# Patient Record
Sex: Female | Born: 1990 | Race: Black or African American | Hispanic: No | Marital: Single | Smoking: Never smoker
Health system: Southern US, Community
[De-identification: ages and names within clinical notes are randomized; demographics above are authoritative.]

## PROBLEM LIST (undated history)

## (undated) DIAGNOSIS — I10 Essential (primary) hypertension: Secondary | ICD-10-CM

## (undated) HISTORY — PX: TONSILLECTOMY: SUR1361

---
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
## 2010-12-05 ENCOUNTER — Emergency Department (HOSPITAL_BASED_OUTPATIENT_CLINIC_OR_DEPARTMENT_OTHER)
Admission: EM | Admit: 2010-12-05 | Discharge: 2010-12-06 | Disposition: A | Discharge status: Home / Self Care | Payer: Medicaid Other | Attending: Emergency Medicine | Admitting: Emergency Medicine

## 2010-12-05 DIAGNOSIS — B9689 Other specified bacterial agents as the cause of diseases classified elsewhere: Secondary | ICD-10-CM | POA: Insufficient documentation

## 2010-12-05 DIAGNOSIS — N9489 Other specified conditions associated with female genital organs and menstrual cycle: Secondary | ICD-10-CM | POA: Insufficient documentation

## 2010-12-05 DIAGNOSIS — A499 Bacterial infection, unspecified: Secondary | ICD-10-CM | POA: Insufficient documentation

## 2010-12-05 DIAGNOSIS — N760 Acute vaginitis: Secondary | ICD-10-CM | POA: Insufficient documentation

## 2010-12-05 DIAGNOSIS — J45909 Unspecified asthma, uncomplicated: Secondary | ICD-10-CM | POA: Insufficient documentation

## 2010-12-05 LAB — URINALYSIS, ROUTINE W REFLEX MICROSCOPIC
Bilirubin Urine: NEGATIVE
Glucose, UA: NEGATIVE mg/dL
Ketones, ur: NEGATIVE mg/dL
Nitrite: NEGATIVE
Protein, ur: NEGATIVE mg/dL
Specific Gravity, Urine: 1.012 (ref 1.005–1.030)
Urobilinogen, UA: 0.2 mg/dL (ref 0.0–1.0)
pH: 7 (ref 5.0–8.0)

## 2010-12-05 LAB — PREGNANCY, URINE: Preg Test, Ur: NEGATIVE

## 2010-12-05 LAB — URINE MICROSCOPIC-ADD ON

## 2010-12-05 LAB — WET PREP, GENITAL
Trich, Wet Prep: NONE SEEN
Yeast Wet Prep HPF POC: NONE SEEN

## 2010-12-08 LAB — GC/CHLAMYDIA PROBE AMP, GENITAL
Chlamydia, DNA Probe: POSITIVE — AB
GC Probe Amp, Genital: NEGATIVE

## 2010-12-28 ENCOUNTER — Emergency Department (HOSPITAL_BASED_OUTPATIENT_CLINIC_OR_DEPARTMENT_OTHER)
Admission: EM | Admit: 2010-12-28 | Discharge: 2010-12-28 | Disposition: A | Discharge status: Home / Self Care | Payer: Self-pay | Attending: Emergency Medicine | Admitting: Emergency Medicine

## 2010-12-28 DIAGNOSIS — B9689 Other specified bacterial agents as the cause of diseases classified elsewhere: Secondary | ICD-10-CM | POA: Insufficient documentation

## 2010-12-28 DIAGNOSIS — R109 Unspecified abdominal pain: Secondary | ICD-10-CM | POA: Insufficient documentation

## 2010-12-28 DIAGNOSIS — N760 Acute vaginitis: Secondary | ICD-10-CM | POA: Insufficient documentation

## 2010-12-28 DIAGNOSIS — A499 Bacterial infection, unspecified: Secondary | ICD-10-CM | POA: Insufficient documentation

## 2010-12-28 DIAGNOSIS — J45909 Unspecified asthma, uncomplicated: Secondary | ICD-10-CM | POA: Insufficient documentation

## 2010-12-28 DIAGNOSIS — Z202 Contact with and (suspected) exposure to infections with a predominantly sexual mode of transmission: Secondary | ICD-10-CM | POA: Insufficient documentation

## 2010-12-28 LAB — WET PREP, GENITAL: Yeast Wet Prep HPF POC: NONE SEEN

## 2010-12-28 LAB — URINALYSIS, ROUTINE W REFLEX MICROSCOPIC
Bilirubin Urine: NEGATIVE
Ketones, ur: NEGATIVE mg/dL
Nitrite: NEGATIVE
Specific Gravity, Urine: 1.02 (ref 1.005–1.030)
Urobilinogen, UA: 1 mg/dL (ref 0.0–1.0)

## 2010-12-28 LAB — PREGNANCY, URINE: Preg Test, Ur: NEGATIVE

## 2011-03-13 ENCOUNTER — Emergency Department (HOSPITAL_BASED_OUTPATIENT_CLINIC_OR_DEPARTMENT_OTHER)
Admission: EM | Admit: 2011-03-13 | Discharge: 2011-03-13 | Disposition: A | Discharge status: Home / Self Care | Payer: Medicaid Other | Attending: Emergency Medicine | Admitting: Emergency Medicine

## 2011-03-13 DIAGNOSIS — J45909 Unspecified asthma, uncomplicated: Secondary | ICD-10-CM | POA: Insufficient documentation

## 2011-03-13 DIAGNOSIS — S61209A Unspecified open wound of unspecified finger without damage to nail, initial encounter: Secondary | ICD-10-CM | POA: Insufficient documentation

## 2011-03-13 DIAGNOSIS — X58XXXA Exposure to other specified factors, initial encounter: Secondary | ICD-10-CM | POA: Insufficient documentation

## 2013-04-03 ENCOUNTER — Emergency Department (HOSPITAL_BASED_OUTPATIENT_CLINIC_OR_DEPARTMENT_OTHER)
Admission: EM | Admit: 2013-04-03 | Discharge: 2013-04-03 | Disposition: A | Discharge status: Home / Self Care | Payer: Self-pay | Attending: Emergency Medicine | Admitting: Emergency Medicine

## 2013-04-03 ENCOUNTER — Encounter (HOSPITAL_BASED_OUTPATIENT_CLINIC_OR_DEPARTMENT_OTHER): Payer: Self-pay | Admitting: *Deleted

## 2013-04-03 DIAGNOSIS — R52 Pain, unspecified: Secondary | ICD-10-CM | POA: Insufficient documentation

## 2013-04-03 DIAGNOSIS — Z3202 Encounter for pregnancy test, result negative: Secondary | ICD-10-CM | POA: Insufficient documentation

## 2013-04-03 DIAGNOSIS — R109 Unspecified abdominal pain: Secondary | ICD-10-CM | POA: Insufficient documentation

## 2013-04-03 DIAGNOSIS — A499 Bacterial infection, unspecified: Secondary | ICD-10-CM | POA: Insufficient documentation

## 2013-04-03 DIAGNOSIS — Z8619 Personal history of other infectious and parasitic diseases: Secondary | ICD-10-CM | POA: Insufficient documentation

## 2013-04-03 DIAGNOSIS — Z711 Person with feared health complaint in whom no diagnosis is made: Secondary | ICD-10-CM

## 2013-04-03 DIAGNOSIS — N760 Acute vaginitis: Secondary | ICD-10-CM | POA: Insufficient documentation

## 2013-04-03 DIAGNOSIS — B9689 Other specified bacterial agents as the cause of diseases classified elsewhere: Secondary | ICD-10-CM

## 2013-04-03 DIAGNOSIS — K219 Gastro-esophageal reflux disease without esophagitis: Secondary | ICD-10-CM | POA: Insufficient documentation

## 2013-04-03 LAB — URINALYSIS, ROUTINE W REFLEX MICROSCOPIC
Bilirubin Urine: NEGATIVE
Glucose, UA: NEGATIVE mg/dL
Hgb urine dipstick: NEGATIVE
Ketones, ur: NEGATIVE mg/dL
Leukocytes, UA: NEGATIVE
Nitrite: NEGATIVE
Protein, ur: NEGATIVE mg/dL
Specific Gravity, Urine: 1.025 (ref 1.005–1.030)
Urobilinogen, UA: 1 mg/dL (ref 0.0–1.0)
pH: 7.5 (ref 5.0–8.0)

## 2013-04-03 LAB — WET PREP, GENITAL
Trich, Wet Prep: NONE SEEN
Yeast Wet Prep HPF POC: NONE SEEN

## 2013-04-03 MED ORDER — AZITHROMYCIN 250 MG PO TABS
1000.0000 mg | ORAL_TABLET | Freq: Once | ORAL | Status: AC
  Administered 2013-04-03: 1000 mg via ORAL
  Filled 2013-04-03: qty 4

## 2013-04-03 MED ORDER — CEFTRIAXONE SODIUM 250 MG IJ SOLR
250.0000 mg | Freq: Once | INTRAMUSCULAR | Status: AC
  Administered 2013-04-03: 250 mg via INTRAMUSCULAR
  Filled 2013-04-03: qty 250

## 2013-04-03 MED ORDER — OMEPRAZOLE 20 MG PO CPDR: 20.0000 mg | DELAYED_RELEASE_CAPSULE | Freq: Every day | ORAL | Status: DC

## 2013-04-03 MED ORDER — METRONIDAZOLE 500 MG PO TABS: 500.0000 mg | ORAL_TABLET | Freq: Two times a day (BID) | ORAL | Status: DC

## 2013-04-03 MED ORDER — LIDOCAINE HCL (PF) 1 % IJ SOLN
INTRAMUSCULAR | Status: AC
  Administered 2013-04-03: 5 mL
  Filled 2013-04-03: qty 5

## 2013-04-03 NOTE — ED Notes (Signed)
Pelvic Cart at bedside 

## 2013-04-03 NOTE — ED Provider Notes (Signed)
History    CSN: [id number] Arrival date & time 04/03/13  1544  First MD Initiated Contact with Patient 04/03/13 1609     Chief Complaint  Patient presents with  . Vaginal Discharge   (Consider location/radiation/quality/duration/timing/severity/associated sxs/prior Treatment) Patient is a 22 y.o. female presenting with vaginal discharge. The history is provided by the patient and medical records. No language interpreter was used.  Vaginal Discharge Severity:  Moderate Associated symptoms: abdominal pain (mild, generalized, vague)   Associated symptoms: no dysuria, no fever, no nausea and no vomiting     Mackenzie Noble is a 22 y.o. female  with a Hx of GERD presents to the Emergency Department complaining of gradual, persistent, progressively worsening vaginal discharge worsening 3 weeks ago.  Associated symptoms include malodorous discharge, mild generalized aching throughout the abd. Pt states she has normal vaginal discharge every day for which she wears a panty liner but this is different.  Pt has 1 sexual partner but uses condoms inconsistently   Ibuprofen makes the abd pain better and nothing makes it worse.  Pt denies douching or scented body wash.  Pt with Hx of STD in 2012 which was chlamydia.  Pt was treated at the time, but states she thinks that is what she has again.  Pt denies fever, chills, headache, neck pain, SOB, cough, congestion, N/V/D, weakness, dizziness, syncope, dysuria, hematuria.  LMP 2 weeks ago.   History reviewed. No pertinent past medical history. History reviewed. No pertinent past surgical history. History reviewed. No pertinent family history. History  Substance Use Topics  . Smoking status: Not on file  . Smokeless tobacco: Not on file  . Alcohol Use: Not on file   OB History   Grav Para Term Preterm Abortions TAB SAB Ect Mult Living                 Review of Systems  Constitutional: Negative for fever, diaphoresis, appetite change, fatigue and  unexpected weight change.  HENT: Negative for neck pain and neck stiffness.   Respiratory: Negative for chest tightness and shortness of breath.   Cardiovascular: Negative for chest pain.  Gastrointestinal: Positive for abdominal pain (mild, generalized, vague). Negative for nausea, vomiting, diarrhea, constipation, blood in stool, abdominal distention and rectal pain.  Genitourinary: Positive for vaginal discharge. Negative for dysuria, urgency, frequency, hematuria, flank pain, vaginal bleeding, difficulty urinating, vaginal pain, menstrual problem and pelvic pain.  Musculoskeletal: Negative for back pain.  Skin: Negative for rash.  Neurological: Negative for dizziness, weakness and light-headedness.  All other systems reviewed and are negative.    Allergies  Sulfa antibiotics  Home Medications   Current Outpatient Rx  Name  Route  Sig  Dispense  Refill  . metroNIDAZOLE (FLAGYL) 500 MG tablet   Oral   Take 1 tablet (500 mg total) by mouth 2 (two) times daily. One po bid x 7 days   14 tablet   0   . omeprazole (PRILOSEC) 20 MG capsule   Oral   Take 1 capsule (20 mg total) by mouth daily.   30 capsule   0    BP 127/76  Pulse 79  Temp(Src) 98.3 F (36.8 C) (Oral)  Resp 18  SpO2 99%  LMP 03/20/2013 Physical Exam  Nursing note and vitals reviewed. Constitutional: She is oriented to person, place, and time. She appears well-developed and well-nourished. No distress.  HENT:  Head: Normocephalic and atraumatic.  Eyes: Conjunctivae are normal. No scleral icterus.  Neck: Normal range of motion.  Neck supple.  Cardiovascular: Normal rate, regular rhythm, normal heart sounds and intact distal pulses.   No murmur heard. Pulmonary/Chest: Effort normal and breath sounds normal. No respiratory distress. She has no wheezes.  Abdominal: Soft. Normal appearance and bowel sounds are normal. She exhibits no distension and no mass. There is no hepatosplenomegaly. There is generalized  tenderness (very mild, generalized). There is no rebound, no guarding and no CVA tenderness.  Genitourinary: Uterus normal. Pelvic exam was performed with patient supine. There is no rash, tenderness, lesion or injury on the right labia. There is no rash, tenderness, lesion or injury on the left labia. Uterus is not deviated, not enlarged, not fixed and not tender. Cervix exhibits no motion tenderness, no discharge and no friability. Right adnexum displays no mass, no tenderness and no fullness. Left adnexum displays no mass, no tenderness and no fullness. No erythema, tenderness or bleeding around the vagina. No foreign body around the vagina. No signs of injury around the vagina. Vaginal discharge (moderate, thin, white) found.  Musculoskeletal: Normal range of motion. She exhibits no edema.  Lymphadenopathy:    She has no cervical adenopathy.  Neurological: She is alert and oriented to person, place, and time. She exhibits normal muscle tone. Coordination normal.  Speech is clear and goal oriented Moves extremities without ataxia  Skin: Skin is warm and dry. No rash noted. She is not diaphoretic.  Psychiatric: She has a normal mood and affect.    ED Course  Procedures (including critical care time) Labs Reviewed  WET PREP, GENITAL - Abnormal; Notable for the following:    Clue Cells Wet Prep HPF POC MODERATE (*)    WBC, Wet Prep HPF POC TOO NUMEROUS TO COUNT (*)    All other components within normal limits  GC/CHLAMYDIA PROBE AMP  URINALYSIS, ROUTINE W REFLEX MICROSCOPIC  PREGNANCY, URINE  RPR  HIV ANTIBODY (ROUTINE TESTING)   No results found. 1. BV (bacterial vaginosis)   2. Concern about STD in female without diagnosis     MDM  Mackenzie Noble presents with vaginal discharge and concern for STD.  UA without evidence of urinary tract infection. Pt understands that they have GC/Chlamydia, syphilis and HIV cultures pending and that they will need to inform all sexual partners if  results return positive. Pt has been treated prophylacticly with azithromycin and rocephin due to pts history, pelvic exam, and wet prep with increased WBCs. Pt not concerning for PID because hemodynamically stable and no cervical motion tenderness on pelvic exam. Pt has also been treated with flagyl for Bacterial Vaginosis. Pt has been advised to not drink alcohol while on this medication. Patient to be discharged with instructions to follow up with OBGYN. Discussed importance of using protection when sexually active.  I have also discussed reasons to return immediately to the ER.  Patient expresses understanding and agrees with plan.    Mackenzie Client Jaimey Franchini, PA-C 04/03/13 1750

## 2013-04-03 NOTE — ED Provider Notes (Signed)
Medical screening examination/treatment/procedure(s) were performed by non-physician practitioner and as supervising physician I was immediately available for consultation/collaboration.  Ethelda Chick, MD 04/03/13 [phone]

## 2013-04-03 NOTE — ED Notes (Signed)
Pt amb to triage with quick steady gait smiling in nad. Pt reports vag discharge with "funny smell". Also cramping at times, none present at this time.

## 2013-04-04 LAB — RPR: RPR Ser Ql: NONREACTIVE

## 2013-04-04 LAB — GC/CHLAMYDIA PROBE AMP: CT Probe RNA: POSITIVE — AB

## 2013-04-05 NOTE — ED Notes (Signed)
+   Gonorrhea +Chlamydia Treated with Rocephin and Zithromax

## 2013-04-06 ENCOUNTER — Telehealth (HOSPITAL_COMMUNITY): Payer: Self-pay | Admitting: *Deleted

## 2013-04-08 ENCOUNTER — Telehealth (HOSPITAL_COMMUNITY): Payer: Self-pay | Admitting: Emergency Medicine

## 2013-04-11 ENCOUNTER — Telehealth (HOSPITAL_COMMUNITY): Payer: Self-pay | Admitting: Emergency Medicine

## 2013-04-11 NOTE — Telephone Encounter (Signed)
Patient notified of + gonorrhea and chlamydia results after ID verified x three. STD instructions provided. Patient verbalized understanding.

## 2013-06-02 ENCOUNTER — Encounter (HOSPITAL_BASED_OUTPATIENT_CLINIC_OR_DEPARTMENT_OTHER): Payer: Self-pay

## 2013-06-02 ENCOUNTER — Emergency Department (HOSPITAL_BASED_OUTPATIENT_CLINIC_OR_DEPARTMENT_OTHER)
Admission: EM | Admit: 2013-06-02 | Discharge: 2013-06-02 | Disposition: A | Discharge status: Home / Self Care | Payer: Medicaid Other | Attending: Emergency Medicine | Admitting: Emergency Medicine

## 2013-06-02 DIAGNOSIS — N898 Other specified noninflammatory disorders of vagina: Secondary | ICD-10-CM | POA: Insufficient documentation

## 2013-06-02 DIAGNOSIS — Z3202 Encounter for pregnancy test, result negative: Secondary | ICD-10-CM | POA: Insufficient documentation

## 2013-06-02 DIAGNOSIS — R300 Dysuria: Secondary | ICD-10-CM | POA: Insufficient documentation

## 2013-06-02 DIAGNOSIS — Z8619 Personal history of other infectious and parasitic diseases: Secondary | ICD-10-CM | POA: Insufficient documentation

## 2013-06-02 LAB — URINALYSIS, ROUTINE W REFLEX MICROSCOPIC
Glucose, UA: NEGATIVE mg/dL
Hgb urine dipstick: NEGATIVE
Specific Gravity, Urine: 1.042 — ABNORMAL HIGH (ref 1.005–1.030)
Urobilinogen, UA: 0.2 mg/dL (ref 0.0–1.0)
pH: 6 (ref 5.0–8.0)

## 2013-06-02 LAB — PREGNANCY, URINE: Preg Test, Ur: NEGATIVE

## 2013-06-02 LAB — WET PREP, GENITAL: Yeast Wet Prep HPF POC: NONE SEEN

## 2013-06-02 NOTE — ED Provider Notes (Signed)
CSN: [id number]     Arrival date & time 06/02/13  1205 History   First MD Initiated Contact with Patient 06/02/13 1230     Chief Complaint  Patient presents with  . Dysuria   (Consider location/radiation/quality/duration/timing/severity/associated sxs/prior Treatment) Patient is a 22 y.o. female presenting with dysuria.  Dysuria  Pt reports a month of burning with urination. She states she took a Macrobid once weekly with no improvement. Has also taken AZO with some relief. She denies any fever, back pain or vomiting. She was seen about 2 months ago for similar and found to have BV and also treated for GC/C which came back positive. She states single sexual partner who was also treated at that time and she does not have any concerns for recurrent infection. She thinks she may have a yeast infection but denies any discharge or bleeding.   History reviewed. No pertinent past medical history. History reviewed. No pertinent past surgical history. No family history on file. History  Substance Use Topics  . Smoking status: Never Smoker   . Smokeless tobacco: Not on file  . Alcohol Use: Not on file   OB History   Grav Para Term Preterm Abortions TAB SAB Ect Mult Living                 Review of Systems  Genitourinary: Positive for dysuria.   All other systems reviewed and are negative except as noted in HPI.   Allergies  Sulfa antibiotics  Home Medications  No current outpatient prescriptions on file. BP 144/94  Pulse 72  Temp(Src) 98.7 F (37.1 C) (Oral)  Resp 18  SpO2 100%  LMP 05/08/2013 Physical Exam  Nursing note and vitals reviewed. Constitutional: She is oriented to person, place, and time. She appears well-developed and well-nourished.  HENT:  Head: Normocephalic and atraumatic.  Right Ear: Tympanic membrane normal.  Left Ear: Tympanic membrane normal.  Eyes: EOM are normal. Pupils are equal, round, and reactive to light.  Neck: Normal range of motion. Neck supple.   Cardiovascular: Normal rate, normal heart sounds and intact distal pulses.   Pulmonary/Chest: Effort normal and breath sounds normal.  Abdominal: Bowel sounds are normal. She exhibits no distension. There is no tenderness.  Genitourinary:  Thin white vaginal discharge, no external signs of yeast, no CMT or cervical friability, diffuse lower abdominal tenderness, no mass  Musculoskeletal: Normal range of motion. She exhibits no edema and no tenderness.  Neurological: She is alert and oriented to person, place, and time. She has normal strength. No cranial nerve deficit or sensory deficit.  Skin: Skin is warm and dry. No rash noted.  Psychiatric: She has a normal mood and affect.    ED Course  Procedures (including critical care time) Labs Review Labs Reviewed  WET PREP, GENITAL - Abnormal; Notable for the following:    WBC, Wet Prep HPF POC FEW (*)    All other components within normal limits  URINALYSIS, ROUTINE W REFLEX MICROSCOPIC - Abnormal; Notable for the following:    Specific Gravity, Urine 1.042 (*)    All other components within normal limits  GC/CHLAMYDIA PROBE AMP  PREGNANCY, URINE   Imaging Review No results found.  MDM   1. Dysuria   2. Vaginal discharge     UA neg for signs of infection. No BV or yeast on wet prep. Pt adamant that she cannot possibly have STD and wants to wait for GC/C swab results before being treated. She was advised to see  PCP or Urology for persistent dysuria despite negative UA.     Marino Rogerson B. Bernette Mayers, MD 06/02/13 1347
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
## 2015-03-11 ENCOUNTER — Emergency Department (HOSPITAL_BASED_OUTPATIENT_CLINIC_OR_DEPARTMENT_OTHER)
Admission: EM | Admit: 2015-03-11 | Discharge: 2015-03-11 | Disposition: A | Discharge status: Home / Self Care | Payer: Medicaid Other | Attending: Emergency Medicine | Admitting: Emergency Medicine

## 2015-03-11 ENCOUNTER — Encounter (HOSPITAL_BASED_OUTPATIENT_CLINIC_OR_DEPARTMENT_OTHER): Payer: Self-pay | Admitting: *Deleted

## 2015-03-11 DIAGNOSIS — M25571 Pain in right ankle and joints of right foot: Secondary | ICD-10-CM | POA: Insufficient documentation

## 2015-03-11 MED ORDER — ACETAMINOPHEN 500 MG PO TABS
1000.0000 mg | ORAL_TABLET | Freq: Once | ORAL | Status: AC
  Administered 2015-03-11: 1000 mg via ORAL
  Filled 2015-03-11: qty 2

## 2015-03-11 NOTE — ED Notes (Signed)
Rt foot elevated, ice pack applied 

## 2015-03-11 NOTE — Discharge Instructions (Signed)

## 2015-03-11 NOTE — ED Notes (Signed)
Having rt ankle pain,
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
## 2019-02-26 ENCOUNTER — Other Ambulatory Visit: Payer: Self-pay

## 2019-02-26 ENCOUNTER — Encounter (HOSPITAL_BASED_OUTPATIENT_CLINIC_OR_DEPARTMENT_OTHER): Payer: Self-pay | Admitting: *Deleted

## 2019-02-26 ENCOUNTER — Emergency Department (HOSPITAL_BASED_OUTPATIENT_CLINIC_OR_DEPARTMENT_OTHER)
Admission: EM | Admit: 2019-02-26 | Discharge: 2019-02-26 | Disposition: A | Discharge status: Home / Self Care | Payer: Medicaid Other | Attending: Emergency Medicine | Admitting: Emergency Medicine

## 2019-02-26 DIAGNOSIS — R42 Dizziness and giddiness: Secondary | ICD-10-CM

## 2019-02-26 DIAGNOSIS — Z79899 Other long term (current) drug therapy: Secondary | ICD-10-CM | POA: Insufficient documentation

## 2019-02-26 DIAGNOSIS — Z3A27 27 weeks gestation of pregnancy: Secondary | ICD-10-CM | POA: Diagnosis not present

## 2019-02-26 DIAGNOSIS — O99355 Diseases of the nervous system complicating the puerperium: Secondary | ICD-10-CM | POA: Insufficient documentation

## 2019-02-26 LAB — COMPREHENSIVE METABOLIC PANEL
ALT: 10 U/L (ref 0–44)
AST: 13 U/L — ABNORMAL LOW (ref 15–41)
Albumin: 3 g/dL — ABNORMAL LOW (ref 3.5–5.0)
Alkaline Phosphatase: 89 U/L (ref 38–126)
Anion gap: 8 (ref 5–15)
BUN: 8 mg/dL (ref 6–20)
CO2: 23 mmol/L (ref 22–32)
Calcium: 9 mg/dL (ref 8.9–10.3)
Chloride: 104 mmol/L (ref 98–111)
Creatinine, Ser: 0.46 mg/dL (ref 0.44–1.00)
GFR calc Af Amer: >60 mL/min (ref >60)
GFR calc non Af Amer: >60 mL/min (ref >60)
Glucose, Bld: 95 mg/dL (ref 70–99)
Potassium: 3.7 mmol/L (ref 3.5–5.1)
Sodium: 135 mmol/L (ref 135–145)
Total Bilirubin: 0.3 mg/dL (ref 0.3–1.2)
Total Protein: 6.4 g/dL — ABNORMAL LOW (ref 6.5–8.1)

## 2019-02-26 LAB — CBC WITH DIFFERENTIAL/PLATELET
Abs Immature Granulocytes: 0.06 10*3/uL (ref 0.00–0.07)
Basophils Absolute: 0 10*3/uL (ref 0.0–0.1)
Basophils Relative: 0 %
Eosinophils Absolute: 0.1 10*3/uL (ref 0.0–0.5)
Eosinophils Relative: 1 %
HCT: 33.9 % — ABNORMAL LOW (ref 36.0–46.0)
Hemoglobin: 10.7 g/dL — ABNORMAL LOW (ref 12.0–15.0)
Immature Granulocytes: 1 %
Lymphocytes Relative: 18 %
Lymphs Abs: 1.8 10*3/uL (ref 0.7–4.0)
MCH: 24.7 pg — ABNORMAL LOW (ref 26.0–34.0)
MCHC: 31.6 g/dL (ref 30.0–36.0)
MCV: 78.1 fL — ABNORMAL LOW (ref 80.0–100.0)
Monocytes Absolute: 0.8 10*3/uL (ref 0.1–1.0)
Monocytes Relative: 8 %
Neutro Abs: 7.2 10*3/uL (ref 1.7–7.7)
Neutrophils Relative %: 72 %
Platelets: 227 10*3/uL (ref 150–400)
RBC: 4.34 MIL/uL (ref 3.87–5.11)
RDW: 15.4 % (ref 11.5–15.5)
WBC: 10 10*3/uL (ref 4.0–10.5)
nRBC: 0 % (ref 0.0–0.2)

## 2019-02-26 LAB — URINALYSIS, ROUTINE W REFLEX MICROSCOPIC
Bilirubin Urine: NEGATIVE
Glucose, UA: NEGATIVE mg/dL
Hgb urine dipstick: NEGATIVE
Ketones, ur: NEGATIVE mg/dL
Leukocytes,Ua: NEGATIVE
Nitrite: NEGATIVE
Protein, ur: NEGATIVE mg/dL
Specific Gravity, Urine: 1.02 (ref 1.005–1.030)
pH: 7.5 (ref 5.0–8.0)

## 2019-02-26 MED ORDER — MECLIZINE HCL 25 MG PO TABS: 25.0000 mg | ORAL_TABLET | Freq: Three times a day (TID) | ORAL | 0 refills | Status: DC | PRN

## 2019-02-26 MED ORDER — MECLIZINE HCL 25 MG PO TABS
25.0000 mg | ORAL_TABLET | Freq: Once | ORAL | Status: DC
  Filled 2019-02-26: qty 1

## 2019-02-26 NOTE — ED Notes (Signed)
FHR for first 155; 2nd FHR 148

## 2019-02-26 NOTE — ED Provider Notes (Signed)
[facility] EMERGENCY DEPARTMENT Provider Note   CSN: [id number] Arrival date & time: 02/26/19  1047    History   Chief Complaint Chief Complaint  Patient presents with  . Dizziness    HPI Mackenzie Noble is a 28 y.o. female.     HPI Patient is roughly [redacted] weeks pregnant with twin pregnancy.  Was on her way to work this morning when she had acute onset spinning sensation.  Symptoms seem to have improved significantly.  Denies any associated pain.  States she is had symptoms in the past.  Also states she has intermittent ringing in her left ear.  No fever or chills.  Denies chest pain or shortness of breath.  No abdominal pain.  No vaginal bleeding. History reviewed. No pertinent past medical history.  There are no active problems to display for this patient.   Past Surgical History:  Procedure Laterality Date  . TONSILLECTOMY       OB History    Gravida  3   Para  2   Term      Preterm      AB      Living        SAB      TAB      Ectopic      Multiple      Live Births               Home Medications    Prior to Admission medications   Medication Sig Start Date End Date Taking? Authorizing Provider  meclizine (ANTIVERT) 25 MG tablet Take 1 tablet (25 mg total) by mouth 3 (three) times daily as needed for dizziness. 02/26/19   Loren RacerYelverton, Tearra Ouk, MD  Prenatal Vit-Fe Fumarate-FA (PRENATAL VITAMIN) 27-0.8 MG TABS Take 1 tablet by mouth 1 day or 1 dose.    [provider]    Family History History reviewed. No pertinent family history.  Social History Social History   Tobacco Use  . Smoking status: Never Smoker  . Smokeless tobacco: Never Used  Substance Use Topics  . Alcohol use: Not Currently    Comment: pt is currently pregnant  . Drug use: Never     Allergies   Sulfa antibiotics   Review of Systems Review of Systems  Constitutional: Negative for chills and fever.  HENT: Negative for trouble swallowing.   Eyes:  Negative for visual disturbance.  Respiratory: Negative for cough and shortness of breath.   Cardiovascular: Negative for chest pain and palpitations.  Gastrointestinal: Negative for abdominal pain, diarrhea and vomiting.  Genitourinary: Negative for dysuria, flank pain, frequency, hematuria, pelvic pain, vaginal bleeding and vaginal discharge.  Musculoskeletal: Negative for back pain, myalgias and neck pain.  Skin: Negative for rash and wound.  Neurological: Positive for dizziness. Negative for syncope, weakness, light-headedness, numbness and headaches.  All other systems reviewed and are negative.    Physical Exam Updated Vital Signs BP 103/68 (BP Location: Left Arm)   Pulse 88   Temp 98.3 F (36.8 C) (Oral)   Resp 19   Ht 5' (1.524 m)   Wt 122.5 kg   LMP 08/23/2018 (Approximate)   SpO2 100%   BMI 52.73 kg/m   Physical Exam Vitals signs and nursing note reviewed.  Constitutional:      Appearance: Normal appearance. She is well-developed.  HENT:     Head: Normocephalic and atraumatic.     Nose: Nose normal.     Mouth/Throat:     Mouth: Mucous membranes  are moist.  Eyes:     Pupils: Pupils are equal, round, and reactive to light.     Comments: Fatigable rotary nystagmus especially with sitting up.  Neck:     Musculoskeletal: Normal range of motion and neck supple. No neck rigidity or muscular tenderness.  Cardiovascular:     Rate and Rhythm: Normal rate and regular rhythm.     Heart sounds: No murmur. No friction rub. No gallop.   Pulmonary:     Effort: Pulmonary effort is normal. No respiratory distress.     Breath sounds: Normal breath sounds. No stridor. No wheezing, rhonchi or rales.  Chest:     Chest wall: No tenderness.  Abdominal:     General: Bowel sounds are normal.     Palpations: Abdomen is soft.     Tenderness: There is no abdominal tenderness. There is no guarding or rebound.     Comments: Gravid abdomen.  Nontender to palpation.  Musculoskeletal:  Normal range of motion.        General: No swelling, tenderness, deformity or signs of injury.     Right lower leg: No edema.     Left lower leg: No edema.  Lymphadenopathy:     Cervical: No cervical adenopathy.  Skin:    General: Skin is warm and dry.     Findings: No erythema or rash.  Neurological:     General: No focal deficit present.     Mental Status: She is alert and oriented to person, place, and time.  Psychiatric:        Mood and Affect: Mood normal.        Behavior: Behavior normal.      ED Treatments / Results  Labs (all labs ordered are listed, but only abnormal results are displayed) Labs Reviewed  CBC WITH DIFFERENTIAL/PLATELET - Abnormal; Notable for the following components:      Result Value   Hemoglobin 10.7 (*)    HCT 33.9 (*)    MCV 78.1 (*)    MCH 24.7 (*)    All other components within normal limits  COMPREHENSIVE METABOLIC PANEL - Abnormal; Notable for the following components:   Total Protein 6.4 (*)    Albumin 3.0 (*)    AST 13 (*)    All other components within normal limits  URINALYSIS, ROUTINE W REFLEX MICROSCOPIC    EKG None  Radiology No results found.  Procedures Procedures (including critical care time)  Medications Ordered in ED Medications  meclizine (ANTIVERT) tablet 25 mg (25 mg Oral Not Given 02/26/19 1158)     Initial Impression / Assessment and Plan / ED Course  I have reviewed the triage vital signs and the nursing notes.  Pertinent labs & imaging results that were available during my care of the patient were reviewed by me and considered in my medical decision making (see chart for details).       Patient states her symptoms have resolved.  Patient has a normal neurologic exam.  Vital signs are stable.  Suspect symptoms due to peripheral vertigo.  Will give prescription for meclizine.  Advised to follow-up closely with her OB/GYN.  Return precautions given.  Final Clinical Impressions(s) / ED Diagnoses   Final  diagnoses:  Vertigo    ED Discharge Orders         Ordered    meclizine (ANTIVERT) 25 MG tablet  3 times daily PRN     02/26/19 1303  Loren Racer, MD 02/26/19 1454

## 2019-02-26 NOTE — ED Notes (Signed)
ED Provider at bedside. 

## 2019-02-26 NOTE — ED Triage Notes (Signed)
Feeling dizzy on her way to work.  She is [redacted] weeks pregnant.

## 2019-02-26 NOTE — ED Notes (Signed)
Patient does not want to take Meclizine at this time.  She stated if we can wait until 1200. She stated that she feels a little better.  She don't want to take any medicine due to her pregnancy.  Patient is alert and oriented.

## 2020-01-05 ENCOUNTER — Encounter (HOSPITAL_BASED_OUTPATIENT_CLINIC_OR_DEPARTMENT_OTHER): Payer: Self-pay | Admitting: Emergency Medicine

## 2020-01-05 ENCOUNTER — Emergency Department (HOSPITAL_BASED_OUTPATIENT_CLINIC_OR_DEPARTMENT_OTHER)
Admission: EM | Admit: 2020-01-05 | Discharge: 2020-01-06 | Disposition: A | Discharge status: Home / Self Care | Payer: Medicaid Other | Attending: Emergency Medicine | Admitting: Emergency Medicine

## 2020-01-05 ENCOUNTER — Other Ambulatory Visit: Payer: Self-pay

## 2020-01-05 DIAGNOSIS — N390 Urinary tract infection, site not specified: Secondary | ICD-10-CM | POA: Diagnosis not present

## 2020-01-05 DIAGNOSIS — B9689 Other specified bacterial agents as the cause of diseases classified elsewhere: Secondary | ICD-10-CM

## 2020-01-05 DIAGNOSIS — Z79899 Other long term (current) drug therapy: Secondary | ICD-10-CM | POA: Diagnosis not present

## 2020-01-05 DIAGNOSIS — N760 Acute vaginitis: Secondary | ICD-10-CM | POA: Insufficient documentation

## 2020-01-05 DIAGNOSIS — N899 Noninflammatory disorder of vagina, unspecified: Secondary | ICD-10-CM | POA: Diagnosis present

## 2020-01-05 NOTE — ED Triage Notes (Signed)
Pt arrives with complaints of vaginal itching and odorous discharge x 3 days.

## 2020-01-06 LAB — URINALYSIS, ROUTINE W REFLEX MICROSCOPIC
Bilirubin Urine: NEGATIVE
Glucose, UA: NEGATIVE mg/dL
Hgb urine dipstick: NEGATIVE
Ketones, ur: NEGATIVE mg/dL
Nitrite: NEGATIVE
Protein, ur: NEGATIVE mg/dL
Specific Gravity, Urine: 1.02 (ref 1.005–1.030)
pH: 7 (ref 5.0–8.0)

## 2020-01-06 LAB — URINALYSIS, MICROSCOPIC (REFLEX)

## 2020-01-06 LAB — WET PREP, GENITAL
Sperm: NONE SEEN
Trich, Wet Prep: NONE SEEN
Yeast Wet Prep HPF POC: NONE SEEN

## 2020-01-06 LAB — PREGNANCY, URINE: Preg Test, Ur: NEGATIVE

## 2020-01-06 MED ORDER — CEFTRIAXONE SODIUM 500 MG IJ SOLR
500.0000 mg | Freq: Once | INTRAMUSCULAR | Status: AC
  Administered 2020-01-06: 01:00:00 500 mg via INTRAMUSCULAR
  Filled 2020-01-06: qty 500

## 2020-01-06 MED ORDER — FLUCONAZOLE 150 MG PO TABS: 150.0000 mg | ORAL_TABLET | Freq: Once | ORAL | 0 refills | Status: AC

## 2020-01-06 MED ORDER — CEPHALEXIN 500 MG PO CAPS: 500.0000 mg | ORAL_CAPSULE | Freq: Two times a day (BID) | ORAL | 0 refills | Status: DC

## 2020-01-06 MED ORDER — AZITHROMYCIN 250 MG PO TABS
1000.0000 mg | ORAL_TABLET | Freq: Once | ORAL | Status: AC
  Administered 2020-01-06: 01:00:00 1000 mg via ORAL
  Filled 2020-01-06: qty 4

## 2020-01-06 MED ORDER — LIDOCAINE HCL (PF) 1 % IJ SOLN
INTRAMUSCULAR | Status: AC
  Administered 2020-01-06: 01:00:00 5 mL
  Filled 2020-01-06: qty 5

## 2020-01-06 MED ORDER — METRONIDAZOLE 500 MG PO TABS
2000.0000 mg | ORAL_TABLET | Freq: Once | ORAL | Status: AC
  Administered 2020-01-06: 01:00:00 2000 mg via ORAL
  Filled 2020-01-06: qty 4

## 2020-01-06 NOTE — ED Provider Notes (Signed)
[facility]ERG[facility] DEPARTMENT Provider Note   CSN: [id number] Arrival date & time: 01/05/20  2345     History Chief Complaint  Patient presents with  . Vaginal Itching    Mackenzie Noble is a 29 y.o. female.  Patient presents with vaginal itching and irritation with a malodorous discharge for several days.  Symptoms began after she accidentally washed her vagina with her fianc's body soap.  She has had similar symptoms with bacterial vaginosis in the past, but not as much irritation.  She has been using fluconazole and it has not helped.        History reviewed. No pertinent past medical history.  There are no problems to display for this patient.   Past Surgical History:  Procedure Laterality Date  . TONSILLECTOMY       OB History    Gravida  3   Para  2   Term      Preterm      AB      Living        SAB      TAB      Ectopic      Multiple      Live Births              History reviewed. No pertinent family history.  Social History   Tobacco Use  . Smoking status: Never Smoker  . Smokeless tobacco: Never Used  Substance Use Topics  . Alcohol use: Yes  . Drug use: Never    Home Medications Prior to Admission medications   Medication Sig Start Date End Date Taking? Authorizing Provider  cephALEXin (KEFLEX) 500 MG capsule Take 1 capsule (500 mg total) by mouth 2 (two) times daily. 01/06/20   Orpah Greek, MD  fluconazole (DIFLUCAN) 150 MG tablet Take 1 tablet (150 mg total) by mouth once for 1 dose. 01/06/20 01/06/20  Orpah Greek, MD  meclizine (ANTIVERT) 25 MG tablet Take 1 tablet (25 mg total) by mouth 3 (three) times daily as needed for dizziness. 02/26/19   Julianne Rice, MD  Prenatal Vit-Fe Fumarate-FA (PRENATAL VITAMIN) 27-0.8 MG TABS Take 1 tablet by mouth 1 day or 1 dose.    [provider]    Allergies    Sulfa antibiotics and Metronidazole  Review of Systems   Review of Systems    Genitourinary: Positive for vaginal discharge.  All other systems reviewed and are negative.   Physical Exam Updated Vital Signs BP (!) 142/90 (BP Location: Left Arm)   Pulse 74   Temp 98.6 F (37 C) (Oral)   Resp 14   Wt 131.5 kg   LMP 12/07/2019 (Approximate)   SpO2 100%   Breastfeeding No   BMI 56.60 kg/m   Physical Exam Vitals and nursing note reviewed.  Constitutional:      Appearance: Normal appearance.  HENT:     Head: Normocephalic and atraumatic.  Cardiovascular:     Rate and Rhythm: Normal rate and regular rhythm.  Pulmonary:     Effort: Pulmonary effort is normal.     Breath sounds: Normal breath sounds.  Abdominal:     Palpations: Abdomen is soft.     Tenderness: There is no abdominal tenderness.  Skin:    General: Skin is warm and dry.  Neurological:     General: No focal deficit present.     Mental Status: She is alert and oriented to person, place, and time.     ED Results /  Procedures / Treatments   Labs (all labs ordered are listed, but only abnormal results are displayed) Labs Reviewed  WET PREP, GENITAL - Abnormal; Notable for the following components:      Result Value   Clue Cells Wet Prep HPF POC PRESENT (*)    WBC, Wet Prep HPF POC MANY (*)    All other components within normal limits  URINALYSIS, ROUTINE W REFLEX MICROSCOPIC - Abnormal; Notable for the following components:   APPearance CLOUDY (*)    Leukocytes,Ua MODERATE (*)    All other components within normal limits  URINALYSIS, MICROSCOPIC (REFLEX) - Abnormal; Notable for the following components:   Bacteria, UA MANY (*)    All other components within normal limits  PREGNANCY, URINE  GC/CHLAMYDIA PROBE AMP () NOT AT [facility]    EKG None  Radiology No results found.  Procedures Procedures (including critical care time)  Medications Ordered in ED Medications  cefTRIAXone (ROCEPHIN) injection 500 mg (has no administration in time range)  azithromycin  (ZITHROMAX) tablet 1,000 mg (has no administration in time range)  metroNIDAZOLE (FLAGYL) tablet 2,000 mg (has no administration in time range)    ED Course  I have reviewed the triage vital signs and the nursing notes.  Pertinent labs & imaging results that were available during my care of the patient were reviewed by me and considered in my medical decision making (see chart for details).    MDM Rules/Calculators/A&P                     Many bacteria and clue cells present on wet prep.  Will treat for bacterial vaginosis.  Patient does not tolerate 7 days of Flagyl but does tolerate the single dose.  Urinalysis with 6-10 white cells and many bacteria.  Treat with Keflex.  Empiric Rocephin and Zithromax awaiting GC and Chlamydia results.  Final Clinical Impression(s) / ED Diagnoses Final diagnoses:  Urinary tract infection without hematuria, site unspecified  BV (bacterial vaginosis)    Rx / DC Orders ED Discharge Orders         Ordered    cephALEXin (KEFLEX) 500 MG capsule  2 times daily     01/06/20 0048    fluconazole (DIFLUCAN) 150 MG tablet   Once     01/06/20 0048           Gilda Crease, MD 01/06/20 [phone]

## 2020-01-07 LAB — GC/CHLAMYDIA PROBE AMP (~~LOC~~) NOT AT ARMC
Chlamydia: NEGATIVE
Comment: NEGATIVE
Comment: NORMAL
Neisseria Gonorrhea: NEGATIVE

## 2020-06-21 ENCOUNTER — Emergency Department (HOSPITAL_BASED_OUTPATIENT_CLINIC_OR_DEPARTMENT_OTHER): Payer: Medicaid Other

## 2020-06-21 ENCOUNTER — Other Ambulatory Visit: Payer: Self-pay

## 2020-06-21 ENCOUNTER — Emergency Department (HOSPITAL_BASED_OUTPATIENT_CLINIC_OR_DEPARTMENT_OTHER)
Admission: EM | Admit: 2020-06-21 | Discharge: 2020-06-21 | Disposition: A | Discharge status: Home / Self Care | Payer: Medicaid Other | Attending: Emergency Medicine | Admitting: Emergency Medicine

## 2020-06-21 DIAGNOSIS — R1032 Left lower quadrant pain: Secondary | ICD-10-CM | POA: Diagnosis not present

## 2020-06-21 DIAGNOSIS — N3000 Acute cystitis without hematuria: Secondary | ICD-10-CM | POA: Insufficient documentation

## 2020-06-21 DIAGNOSIS — R102 Pelvic and perineal pain: Secondary | ICD-10-CM

## 2020-06-21 DIAGNOSIS — N898 Other specified noninflammatory disorders of vagina: Secondary | ICD-10-CM | POA: Insufficient documentation

## 2020-06-21 DIAGNOSIS — R1030 Lower abdominal pain, unspecified: Secondary | ICD-10-CM

## 2020-06-21 DIAGNOSIS — R1031 Right lower quadrant pain: Secondary | ICD-10-CM | POA: Insufficient documentation

## 2020-06-21 LAB — WET PREP, GENITAL
Clue Cells Wet Prep HPF POC: NONE SEEN
Sperm: NONE SEEN
Trich, Wet Prep: NONE SEEN
Yeast Wet Prep HPF POC: NONE SEEN

## 2020-06-21 LAB — URINALYSIS, ROUTINE W REFLEX MICROSCOPIC
Bilirubin Urine: NEGATIVE
Glucose, UA: 100 mg/dL — AB
Hgb urine dipstick: NEGATIVE
Ketones, ur: NEGATIVE mg/dL
Nitrite: POSITIVE — AB
Protein, ur: 30 mg/dL — AB
Specific Gravity, Urine: 1.02 (ref 1.005–1.030)
pH: 7.5 (ref 5.0–8.0)

## 2020-06-21 LAB — URINALYSIS, MICROSCOPIC (REFLEX)

## 2020-06-21 LAB — PREGNANCY, URINE: Preg Test, Ur: NEGATIVE

## 2020-06-21 MED ORDER — KETOROLAC TROMETHAMINE 60 MG/2ML IM SOLN
60.0000 mg | Freq: Once | INTRAMUSCULAR | Status: AC
  Administered 2020-06-21: 60 mg via INTRAMUSCULAR
  Filled 2020-06-21: qty 2

## 2020-06-21 MED ORDER — CEPHALEXIN 500 MG PO CAPS: 500.0000 mg | ORAL_CAPSULE | Freq: Three times a day (TID) | ORAL | 0 refills | Status: AC

## 2020-06-21 MED ORDER — NAPROXEN 375 MG PO TABS: 375.0000 mg | ORAL_TABLET | Freq: Two times a day (BID) | ORAL | 0 refills | Status: DC

## 2020-06-21 MED ORDER — PHENAZOPYRIDINE HCL 200 MG PO TABS: 200.0000 mg | ORAL_TABLET | Freq: Three times a day (TID) | ORAL | 0 refills | Status: DC

## 2020-06-21 MED ORDER — CEPHALEXIN 500 MG PO CAPS: 500.0000 mg | ORAL_CAPSULE | Freq: Two times a day (BID) | ORAL | 0 refills | Status: DC

## 2020-06-21 NOTE — ED Notes (Signed)
Pt declined blood draw at this time, feels that her symptoms are related to UTI.

## 2020-06-21 NOTE — ED Triage Notes (Signed)
Pt reports lower abdominal pain and back pain x 1 month. States she believes it to be a UTI. States her urine does Not have an odor. Denies vaginal discharge. Reports her bladder "feels full"

## 2020-06-21 NOTE — ED Provider Notes (Signed)
[facility] EMERGENCY DEPARTMENT Provider Note   CSN: [id number] Arrival date & time: 06/21/20  1747     History Chief Complaint  Patient presents with  . Abdominal Pain    Mackenzie Noble is a 29 y.o. female.  HPI      Pelvic pain for about one month Had appointment for Thursday but daughter sick so didn't make it Today had more fullness in bladder No vaginal discharge, no odor, no dysuria, no frequency Feels like a lot of pressure down there, lower back pain, mostly on the right side Nothing makes it better or worse, 8/10 pain, grandma had old hydrocodone, which helped. Tylenol, ibuprofen, azo 9/10 LMP Had tubal ligation No nausea or vomiting/diarrhea/constipation No fevers No vaginal bleeding    No past medical history on file.  There are no problems to display for this patient.   Past Surgical History:  Procedure Laterality Date  . TONSILLECTOMY       OB History    Gravida  3   Para  2   Term      Preterm      AB      Living        SAB      TAB      Ectopic      Multiple      Live Births              No family history on file.  Social History   Tobacco Use  . Smoking status: Never Smoker  . Smokeless tobacco: Never Used  Substance Use Topics  . Alcohol use: Yes  . Drug use: Never    Home Medications Prior to Admission medications   Medication Sig Start Date End Date Taking? Authorizing Provider  cephALEXin (KEFLEX) 500 MG capsule Take 1 capsule (500 mg total) by mouth 3 (three) times daily for 7 days. 06/21/20 06/28/20  Alvira Monday, MD  meclizine (ANTIVERT) 25 MG tablet Take 1 tablet (25 mg total) by mouth 3 (three) times daily as needed for dizziness. 02/26/19   Loren Racer, MD  naproxen (NAPROSYN) 375 MG tablet Take 1 tablet (375 mg total) by mouth 2 (two) times daily with a meal. 06/21/20   Alvira Monday, MD  phenazopyridine (PYRIDIUM) 200 MG tablet Take 1 tablet (200 mg total) by mouth 3 (three)  times daily. 06/21/20   Alvira Monday, MD  Prenatal Vit-Fe Fumarate-FA (PRENATAL VITAMIN) 27-0.8 MG TABS Take 1 tablet by mouth 1 day or 1 dose.    [provider]    Allergies    Sulfa antibiotics and Metronidazole  Review of Systems   Review of Systems  Constitutional: Negative for appetite change and fever.  HENT: Negative for sore throat.   Respiratory: Negative for cough and shortness of breath.   Cardiovascular: Negative for chest pain.  Gastrointestinal: Positive for abdominal pain. Negative for constipation, diarrhea, nausea and vomiting.  Genitourinary: Positive for pelvic pain. Negative for difficulty urinating and dysuria.  Musculoskeletal: Negative for back pain and neck pain.  Skin: Negative for rash.  Neurological: Negative for syncope, light-headedness and headaches.    Physical Exam Updated Vital Signs BP (!) 150/105 (BP Location: Right Arm)   Pulse 63   Temp 98.3 F (36.8 C)   Resp 14   Ht 5\' 3"  (1.6 m)   Wt 122 kg   LMP 06/06/2020   SpO2 100%   BMI 47.65 kg/m   Physical Exam Vitals and nursing note reviewed.  Constitutional:  General: She is not in acute distress.    Appearance: She is well-developed. She is not diaphoretic.  HENT:     Head: Normocephalic and atraumatic.  Eyes:     Conjunctiva/sclera: Conjunctivae normal.  Cardiovascular:     Rate and Rhythm: Normal rate and regular rhythm.     Heart sounds: Normal heart sounds. No murmur heard.  No friction rub. No gallop.   Pulmonary:     Effort: Pulmonary effort is normal. No respiratory distress.     Breath sounds: Normal breath sounds. No wheezing or rales.  Abdominal:     General: There is no distension.     Palpations: Abdomen is soft.     Tenderness: There is abdominal tenderness in the right lower quadrant, suprapubic area and left lower quadrant. There is no guarding.  Genitourinary:    Vagina: Vaginal discharge and tenderness present.  Musculoskeletal:         General: No tenderness.     Cervical back: Normal range of motion.  Skin:    General: Skin is warm and dry.     Findings: No erythema or rash.  Neurological:     Mental Status: She is alert and oriented to person, place, and time.     ED Results / Procedures / Treatments   Labs (all labs ordered are listed, but only abnormal results are displayed) Labs Reviewed  WET PREP, GENITAL - Abnormal; Notable for the following components:      Result Value   WBC, Wet Prep HPF POC MANY (*)    All other components within normal limits  URINALYSIS, ROUTINE W REFLEX MICROSCOPIC - Abnormal; Notable for the following components:   Color, Urine ORANGE (*)    Glucose, UA 100 (*)    Protein, ur 30 (*)    Nitrite POSITIVE (*)    Leukocytes,Ua TRACE (*)    All other components within normal limits  URINALYSIS, MICROSCOPIC (REFLEX) - Abnormal; Notable for the following components:   Bacteria, UA MANY (*)    All other components within normal limits  PREGNANCY, URINE  GC/CHLAMYDIA PROBE AMP ([place]) NOT AT [facility]    EKG None  Radiology US PELVIC COMPLETE W TRANSVAGINAL AND TORSION R/O  Result Date: 06/21/2020 CLINICAL DATA:  Pelvic pain for 1 month. EXAM: TRANSABDOMINAL AND TRANSVAGINAL ULTRASOUND OF PELVIS DOPPLER ULTRASOUND OF OVARIES TECHNIQUE: Both transabdominal and transvaginal ultrasound examinations of the pelvis were performed. Transabdominal technique was performed for global imaging of the pelvis including uterus, ovaries, adnexal regions, and pelvic cul-de-sac. It was necessary to proceed with endovaginal exam following the transabdominal exam to visualize the endometrium and ovaries. Color and duplex Doppler ultrasound was utilized to evaluate blood flow to the ovaries. COMPARISON:  None. FINDINGS: Uterus Measurements: 11.7 x 5.6 x 7.1 cm = volume: 243 mL. Uterus is anteverted. No fibroids or other mass visualized. Endometrium Thickness: 9 mm. Small amount of fluid in the endocervical  canal may be physiologic. No focal abnormality visualized. Right ovary Measurements: 3.8 x 2.0 x 2.9 cm = volume: 11.1 mL. Normal appearance/no adnexal mass. The ovary is only visualized on transabdominal exam. Normal blood flow. Left ovary Measurements: 3.3 x 2.6 x 2.7 cm = volume: 12.4 mL. Normal appearance/no adnexal mass. Normal blood flow. Pulsed Doppler evaluation of both ovaries demonstrates normal low-resistance arterial and venous waveforms. Other findings Trace pelvic free fluid is likely physiologic. IMPRESSION: 1. Essentially normal pelvic ultrasound. Trace free fluid in the endocervical canal is likely physiologic. 2. Normal sonographic  appearance of the ovaries with normal blood flow. Electronically Signed   By: Narda Rutherford M.D.   On: 06/21/2020 20:54    Procedures Procedures (including critical care time)  Medications Ordered in ED Medications  ketorolac (TORADOL) injection 60 mg (60 mg Intramuscular Given 06/21/20 2037)    ED Course  I have reviewed the triage vital signs and the nursing notes.  Pertinent labs & imaging results that were available during my care of the patient were reviewed by me and considered in my medical decision making (see chart for details).    MDM Rules/Calculators/A&P                          29yo female presents with concern for lower abdominal pain over one month.  DDx includes appendicitis, pancreatitis, cholecystitis, pyelonephritis, nephrolithiasis, diverticulitis, PID, ovarian torsion, ectopic pregnancy, and tuboovarian abscess   Low suspicion for appendicitis, diverticulitis given duration of symptoms--normal appetite, no fevers, no nausea/vomiting/diarrhea.  Pt declines blood work.  Given pelvic pain worsening, waxing/wning did perform pelvic US with doppler showing no abnormalities/no torsion or TOA. Wet prep without trichomonas.   Considered PID treatment but pt believes low risk for infection and UA shows concern for UTI.  Tenderness on  pelvic exam may be bladder and given bladder fullness as primary symptom and UA results feel UTI is most likely etiology of symptoms.  Given rx for pyridium, naproxen and keflex. Patient discharged in stable condition with understanding of reasons to return.  Final Clinical Impression(s) / ED Diagnoses Final diagnoses:  Lower abdominal pain  Acute cystitis without hematuria    Rx / DC Orders ED Discharge Orders         Ordered    phenazopyridine (PYRIDIUM) 200 MG tablet  3 times daily        06/21/20 2110    naproxen (NAPROSYN) 375 MG tablet  2 times daily with meals        06/21/20 2110    cephALEXin (KEFLEX) 500 MG capsule  2 times daily,   Status:  Discontinued        06/21/20 2110    cephALEXin (KEFLEX) 500 MG capsule  3 times daily        06/21/20 2111           Alvira Monday, MD 06/22/20 1233

## 2020-06-23 LAB — GC/CHLAMYDIA PROBE AMP (~~LOC~~) NOT AT ARMC
Chlamydia: NEGATIVE
Comment: NEGATIVE
Comment: NORMAL
Neisseria Gonorrhea: NEGATIVE

## 2020-09-17 ENCOUNTER — Encounter (HOSPITAL_BASED_OUTPATIENT_CLINIC_OR_DEPARTMENT_OTHER): Payer: Self-pay | Admitting: *Deleted

## 2020-09-17 ENCOUNTER — Other Ambulatory Visit: Payer: Self-pay

## 2020-09-17 ENCOUNTER — Emergency Department (HOSPITAL_BASED_OUTPATIENT_CLINIC_OR_DEPARTMENT_OTHER)
Admission: EM | Admit: 2020-09-17 | Discharge: 2020-09-17 | Disposition: A | Discharge status: Home / Self Care | Payer: Medicaid Other | Attending: Emergency Medicine | Admitting: Emergency Medicine

## 2020-09-17 DIAGNOSIS — R10815 Periumbilic abdominal tenderness: Secondary | ICD-10-CM | POA: Insufficient documentation

## 2020-09-17 DIAGNOSIS — R300 Dysuria: Secondary | ICD-10-CM | POA: Diagnosis not present

## 2020-09-17 DIAGNOSIS — R109 Unspecified abdominal pain: Secondary | ICD-10-CM | POA: Diagnosis present

## 2020-09-17 LAB — URINALYSIS, ROUTINE W REFLEX MICROSCOPIC
Bilirubin Urine: NEGATIVE
Glucose, UA: NEGATIVE mg/dL
Hgb urine dipstick: NEGATIVE
Ketones, ur: NEGATIVE mg/dL
Nitrite: NEGATIVE
Protein, ur: NEGATIVE mg/dL
Specific Gravity, Urine: 1.025 (ref 1.005–1.030)
pH: 6 (ref 5.0–8.0)

## 2020-09-17 LAB — COMPREHENSIVE METABOLIC PANEL
ALT: 12 U/L (ref 0–44)
AST: 16 U/L (ref 15–41)
Albumin: 4.4 g/dL (ref 3.5–5.0)
Alkaline Phosphatase: 89 U/L (ref 38–126)
Anion gap: 13 (ref 5–15)
BUN: 13 mg/dL (ref 6–20)
CO2: 19 mmol/L — ABNORMAL LOW (ref 22–32)
Calcium: 9 mg/dL (ref 8.9–10.3)
Chloride: 104 mmol/L (ref 98–111)
Creatinine, Ser: 0.71 mg/dL (ref 0.44–1.00)
GFR, Estimated: >60 mL/min (ref >60)
Glucose, Bld: 82 mg/dL (ref 70–99)
Potassium: 3.2 mmol/L — ABNORMAL LOW (ref 3.5–5.1)
Sodium: 136 mmol/L (ref 135–145)
Total Bilirubin: 0.7 mg/dL (ref 0.3–1.2)
Total Protein: 7.5 g/dL (ref 6.5–8.1)

## 2020-09-17 LAB — CBC WITH DIFFERENTIAL/PLATELET
Abs Immature Granulocytes: 0.01 10*3/uL (ref 0.00–0.07)
Basophils Absolute: 0 10*3/uL (ref 0.0–0.1)
Basophils Relative: 1 %
Eosinophils Absolute: 0.1 10*3/uL (ref 0.0–0.5)
Eosinophils Relative: 2 %
HCT: 38.1 % (ref 36.0–46.0)
Hemoglobin: 12.2 g/dL (ref 12.0–15.0)
Immature Granulocytes: 0 %
Lymphocytes Relative: 10 %
Lymphs Abs: 0.5 10*3/uL — ABNORMAL LOW (ref 0.7–4.0)
MCH: 23.5 pg — ABNORMAL LOW (ref 26.0–34.0)
MCHC: 32 g/dL (ref 30.0–36.0)
MCV: 73.3 fL — ABNORMAL LOW (ref 80.0–100.0)
Monocytes Absolute: 0.7 10*3/uL (ref 0.1–1.0)
Monocytes Relative: 16 %
Neutro Abs: 3.3 10*3/uL (ref 1.7–7.7)
Neutrophils Relative %: 71 %
Platelets: 186 10*3/uL (ref 150–400)
RBC: 5.2 MIL/uL — ABNORMAL HIGH (ref 3.87–5.11)
RDW: 17.1 % — ABNORMAL HIGH (ref 11.5–15.5)
WBC: 4.6 10*3/uL (ref 4.0–10.5)
nRBC: 0 % (ref 0.0–0.2)

## 2020-09-17 LAB — PREGNANCY, URINE: Preg Test, Ur: NEGATIVE

## 2020-09-17 LAB — LIPASE, BLOOD: Lipase: 20 U/L (ref 11–51)

## 2020-09-17 LAB — URINALYSIS, MICROSCOPIC (REFLEX)

## 2020-09-17 MED ORDER — PHENAZOPYRIDINE HCL 200 MG PO TABS: 200.0000 mg | ORAL_TABLET | Freq: Three times a day (TID) | ORAL | 0 refills | Status: AC | PRN

## 2020-09-17 MED ORDER — POTASSIUM CHLORIDE CRYS ER 20 MEQ PO TBCR
40.0000 meq | EXTENDED_RELEASE_TABLET | Freq: Once | ORAL | Status: AC
  Administered 2020-09-17: 16:00:00 40 meq via ORAL
  Filled 2020-09-17: qty 2

## 2020-09-17 MED ORDER — KETOROLAC TROMETHAMINE 60 MG/2ML IM SOLN: 30.0000 mg | Freq: Once | INTRAMUSCULAR | Status: DC

## 2020-09-17 MED ORDER — KETOROLAC TROMETHAMINE 30 MG/ML IJ SOLN
30.0000 mg | Freq: Once | INTRAMUSCULAR | Status: AC
  Administered 2020-09-17: 16:00:00 30 mg via INTRAVENOUS
  Filled 2020-09-17: qty 1

## 2020-09-17 MED ORDER — IBUPROFEN 600 MG PO TABS: 600.0000 mg | ORAL_TABLET | Freq: Four times a day (QID) | ORAL | 0 refills | Status: AC | PRN

## 2020-09-17 NOTE — ED Triage Notes (Signed)
C/o abd pain " UTI" x 4 months

## 2020-09-17 NOTE — ED Provider Notes (Signed)
[facility] EMERGENCY DEPARTMENT Provider Note   CSN: [id number] Arrival date & time: 09/17/20  1411     History Chief Complaint  Patient presents with  . Abdominal Pain    Mackenzie Noble is a 29 y.o. female.  The history is provided by the patient and medical records. No language interpreter was used.  Abdominal Pain    29 year old female history of recurrent urinary tract infection presenting complaining of abdominal discomfort.  Patient mention she has history of ureteral stricture requiring surgical intervention multiple times in the past.  She also reported having recurrent urinary tract infection that she has been seen and evaluated multiple times in the past.  She has been on recurrent antibiotic previously Cipro and currently she is taking amoxicillin for the past several days.  She continues to endorse urinary urgency and abdominal discomfort without burning on urination.  Her symptoms felt similar to prior UTI.  She is making urine she denies any urinary retention she does not complain of any fever nausea vomiting diarrhea constipation.  She does have a urologist but she is actively looking for another urologist because "he wants to do all these invasive test that I am not comfortable with".  She is currently rating her pain a 7 out of 10 pain is sharp and crampy.  Pain primarily located to her periumbilical region.  Pain worse with movement and usually improves with antibiotic.  She is a G3, P4, last menstruation December 1.   History reviewed. No pertinent past medical history.  There are no problems to display for this patient.   Past Surgical History:  Procedure Laterality Date  . TONSILLECTOMY       OB History    Gravida  3   Para  2   Term      Preterm      AB      Living        SAB      IAB      Ectopic      Multiple      Live Births              No family history on file.  Social History   Tobacco Use  . Smoking status:  Never Smoker  . Smokeless tobacco: Never Used  Substance Use Topics  . Alcohol use: Yes  . Drug use: Never    Home Medications Prior to Admission medications   Medication Sig Start Date End Date Taking? Authorizing Provider  meclizine (ANTIVERT) 25 MG tablet Take 1 tablet (25 mg total) by mouth 3 (three) times daily as needed for dizziness. 02/26/19   Loren Racer, MD  naproxen (NAPROSYN) 375 MG tablet Take 1 tablet (375 mg total) by mouth 2 (two) times daily with a meal. 06/21/20   Alvira Monday, MD  phenazopyridine (PYRIDIUM) 200 MG tablet Take 1 tablet (200 mg total) by mouth 3 (three) times daily. 06/21/20   Alvira Monday, MD  Prenatal Vit-Fe Fumarate-FA (PRENATAL VITAMIN) 27-0.8 MG TABS Take 1 tablet by mouth 1 day or 1 dose.    [provider]    Allergies    Sulfa antibiotics and Metronidazole  Review of Systems   Review of Systems  Gastrointestinal: Positive for abdominal pain.  All other systems reviewed and are negative.   Physical Exam Updated Vital Signs BP (!) 148/88   Pulse 85   Temp 99.5 F (37.5 C)   Resp 16   Ht 5\' 2"  (1.575 m)  Wt 108.9 kg   LMP 08/27/2020   SpO2 100%   BMI 43.90 kg/m   Physical Exam Vitals and nursing note reviewed.  Constitutional:      General: She is not in acute distress.    Appearance: She is well-developed and well-nourished.  HENT:     Head: Atraumatic.  Eyes:     Conjunctiva/sclera: Conjunctivae normal.  Cardiovascular:     Rate and Rhythm: Normal rate and regular rhythm.  Pulmonary:     Effort: Pulmonary effort is normal.     Breath sounds: Normal breath sounds.  Abdominal:     General: Abdomen is flat.     Palpations: Abdomen is soft.     Tenderness: There is abdominal tenderness in the periumbilical area and suprapubic area. There is no right CVA tenderness, left CVA tenderness or rebound. Negative signs include Murphy's sign, Rovsing's sign and McBurney's sign.  Musculoskeletal:     Cervical  back: Neck supple.  Skin:    Findings: No rash.  Neurological:     Mental Status: She is alert.  Psychiatric:        Mood and Affect: Mood and affect and mood normal.     ED Results / Procedures / Treatments   Labs (all labs ordered are listed, but only abnormal results are displayed) Labs Reviewed  URINALYSIS, ROUTINE W REFLEX MICROSCOPIC - Abnormal; Notable for the following components:      Result Value   APPearance CLOUDY (*)    Leukocytes,Ua SMALL (*)    All other components within normal limits  CBC WITH DIFFERENTIAL/PLATELET - Abnormal; Notable for the following components:   RBC 5.20 (*)    MCV 73.3 (*)    MCH 23.5 (*)    RDW 17.1 (*)    Lymphs Abs 0.5 (*)    All other components within normal limits  COMPREHENSIVE METABOLIC PANEL - Abnormal; Notable for the following components:   Potassium 3.2 (*)    CO2 19 (*)    All other components within normal limits  URINALYSIS, MICROSCOPIC (REFLEX) - Abnormal; Notable for the following components:   Bacteria, UA MANY (*)    All other components within normal limits  URINE CULTURE  PREGNANCY, URINE  LIPASE, BLOOD    EKG None  Radiology No results found.  Procedures Procedures (including critical care time)  Medications Ordered in ED Medications  potassium chloride SA (KLOR-CON) CR tablet 40 mEq (has no administration in time range)    ED Course  I have reviewed the triage vital signs and the nursing notes.  Pertinent labs & imaging results that were available during my care of the patient were reviewed by me and considered in my medical decision making (see chart for details).    MDM Rules/Calculators/A&P                          BP (!) 147/98 (BP Location: Left Arm)   Pulse 72   Temp 99.5 F (37.5 C)   Resp 17   Ht 5\' 2"  (1.575 m)   Wt 108.9 kg   LMP 08/27/2020   SpO2 100%   BMI 43.90 kg/m   Final Clinical Impression(s) / ED Diagnoses Final diagnoses:  Dysuria    Rx / DC Orders ED  Discharge Orders         Ordered    phenazopyridine (PYRIDIUM) 200 MG tablet  3 times daily PRN        09/17/20 1532  ibuprofen (ADVIL) 600 MG tablet  Every 6 hours PRN        09/17/20 1532         2:37 PM Patient report history of recurrent urinary tract infection as well as history of urethral stricture requiring dilation but have not had a dilation procedure in multiple years.  She also endorsed some evidence of stress incontinence, has been seen evaluate by urology but does not want any invasive intervention.  She is here with complaints of abdominal pain and urinary discomfort and she felt that is similar to prior UTI.  She is however actively taking amoxicillin for the past few days for her urinary tract infection but report symptoms still persist.  She is not endorsing any urinary retention at this time.  She does have mild tenderness to periumbilical region without guarding or rebound tenderness.  She is overall well-appearing.  Will check UA, and basic labs. At this time I have low suspicion for appendicitis or other acute abdominal pathology. Doubt ovarian torsion or tubo-ovarian abscess. Doubt PID.  3:25 PM Normal WBC, mild hypokalemia with a potassium of 3.2, supplementation given, pregnancy test is negative, UA not consistent with  UTI. Urine culture sent however I would be hesitant to start another antibiotic in the setting. At this time, she will encourage patient to follow-up outpatient with urology for further care. She is currently waiting for an appointment, will also provide referral to [facility] as a second place that she can follow-up. She will be notified if urine culture shows any findings that antibiotic may be beneficial. Otherwise patient stable for discharge.     Fayrene Helper, PA-C 09/17/20 1536    Cheryll Cockayne, MD 09/18/20 [phone]

## 2020-09-17 NOTE — Discharge Instructions (Addendum)
You have been evaluated for your abdominal pain and urinary discomfort. Your urine did not show any obvious signs of urinary tract infection. A urine culture have been sent and if it shows an infection  you will be contacted with further instruction. You may take ibuprofen as needed for pain. If having more urinary discomfort you may take Pyridium but please be aware that it may change your urine color to orange temporarily. Return if you have any concern. Follow-up with urologist for further care.

## 2020-09-19 LAB — URINE CULTURE

## 2021-05-19 IMAGING — US US PELVIS COMPLETE TRANSABD/TRANSVAG W DUPLEX
1 series · 13 of 25 positions shown · non-contrast
Comparison: None.

CLINICAL DATA: Pelvic pain for 1 month.

EXAM:
TRANSABDOMINAL AND TRANSVAGINAL ULTRASOUND OF PELVIS
DOPPLER ULTRASOUND OF OVARIES
TECHNIQUE: Both transabdominal and transvaginal ultrasound examinations of the
pelvis were performed. Transabdominal technique was performed for
global imaging of the pelvis including uterus, ovaries, adnexal
regions, and pelvic cul-de-sac.
It was necessary to proceed with endovaginal exam following the
transabdominal exam to visualize the endometrium and ovaries. Color
and duplex Doppler ultrasound was utilized to evaluate blood flow to
the ovaries.

[Series 1: us pelvis complete transabd/transvag w duplex · 13 of 71 slices shown]
[im 1/71]
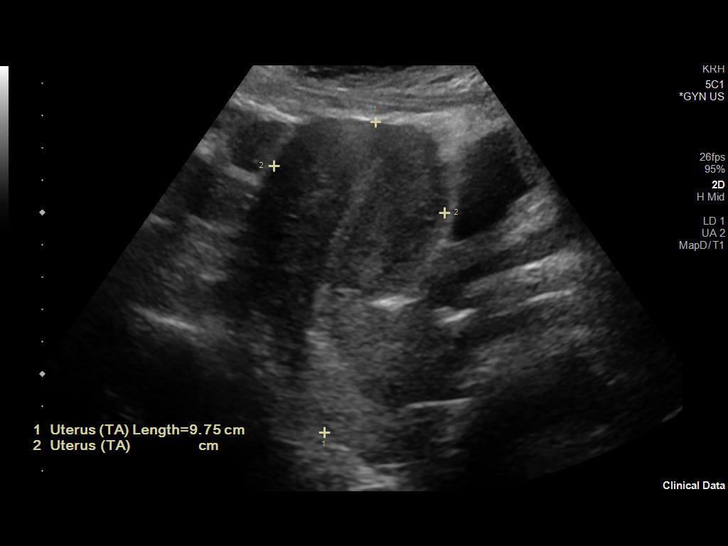
[im 6/71]
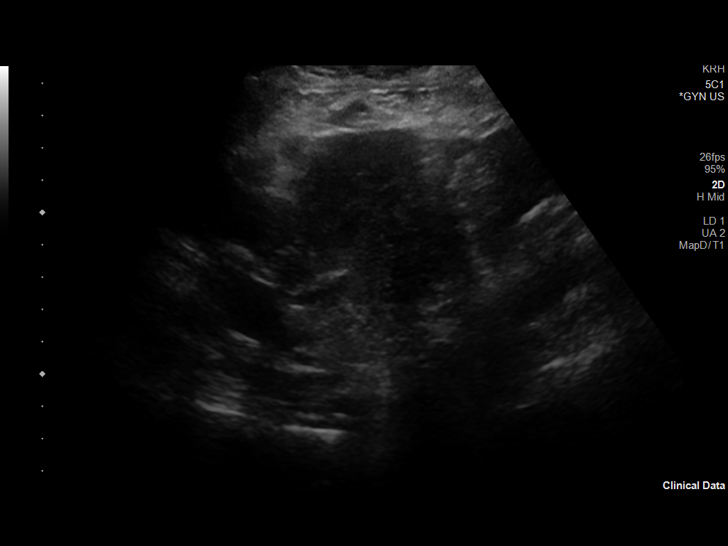
[im 12/71]
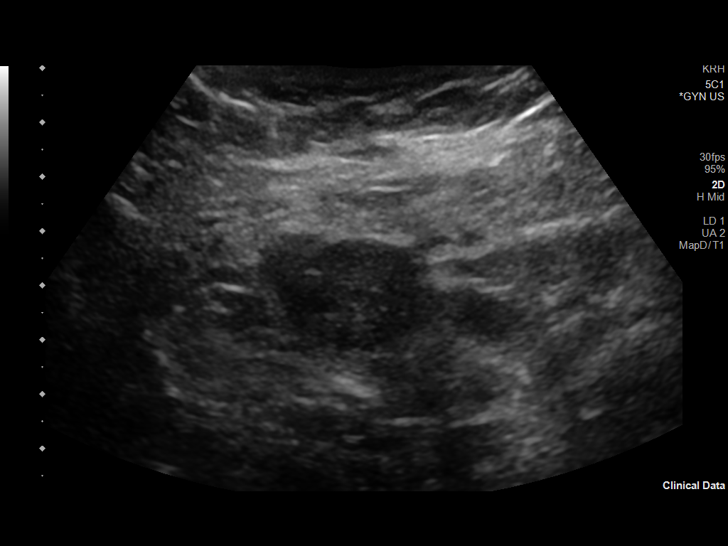
[im 18/71]
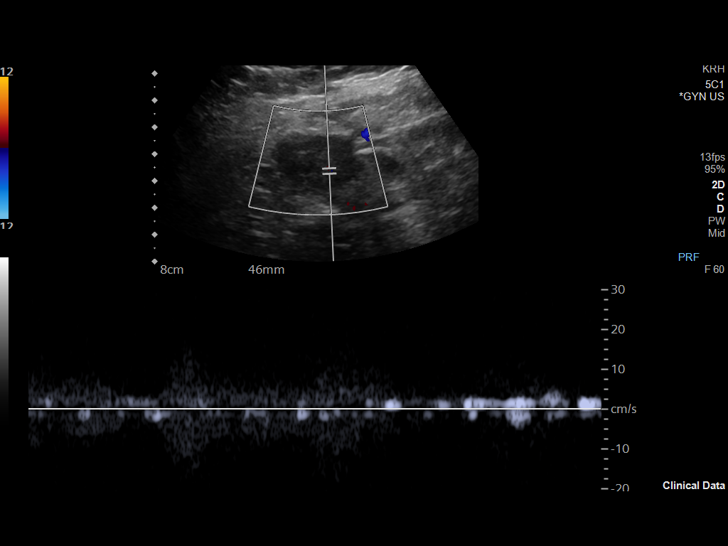
[im 24/71]
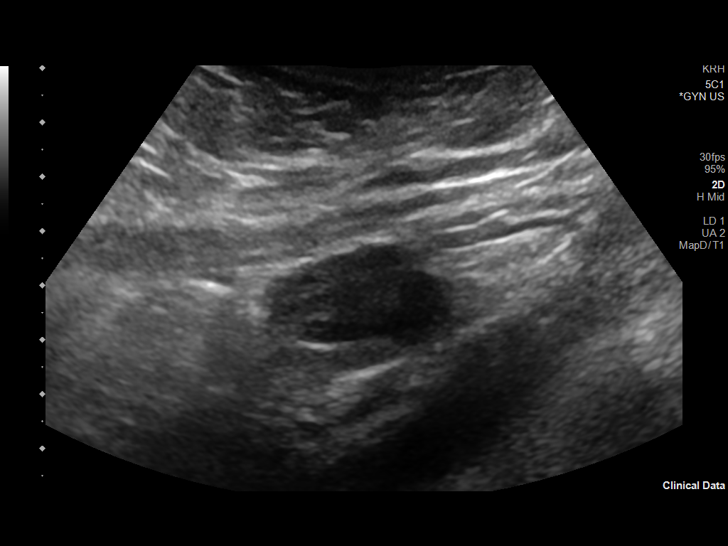
[im 30/71]
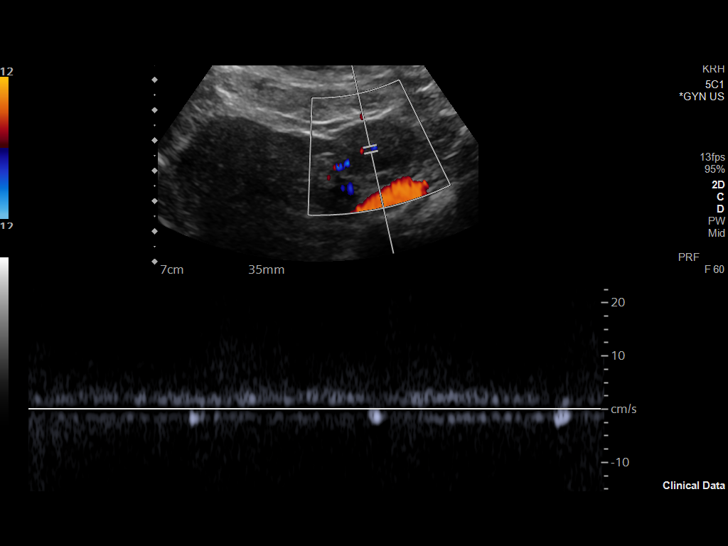
[im 36/71]
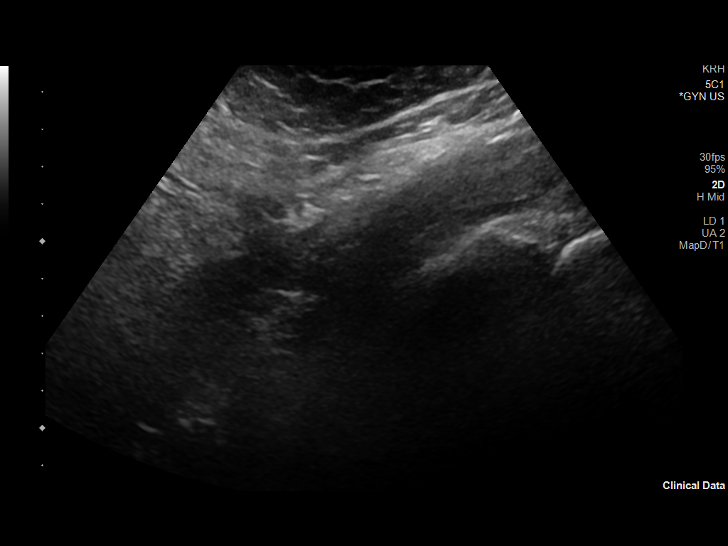
[im 41/71]
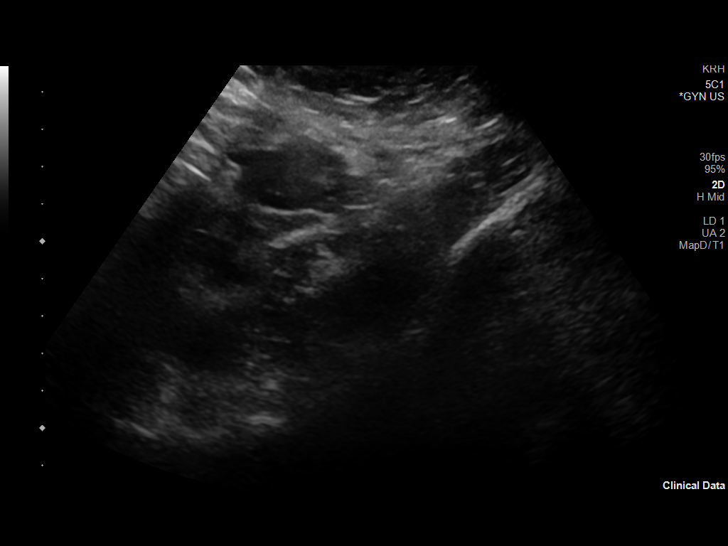
[im 47/71]
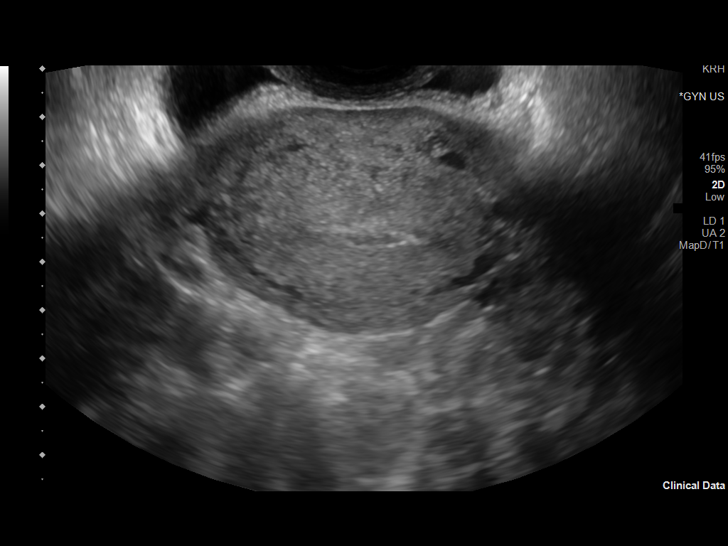
[im 53/71]
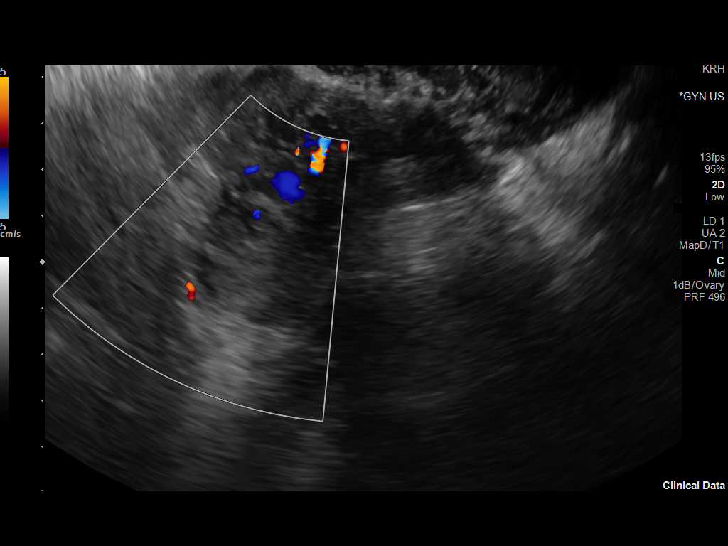
[im 59/71]
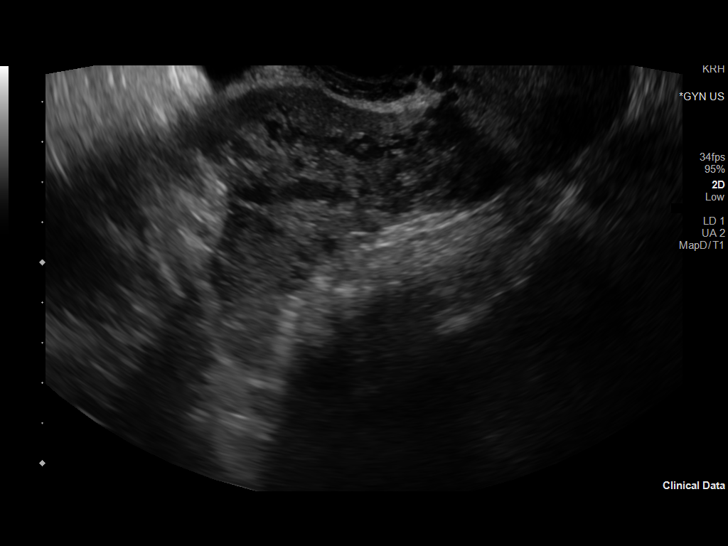
[im 65/71]
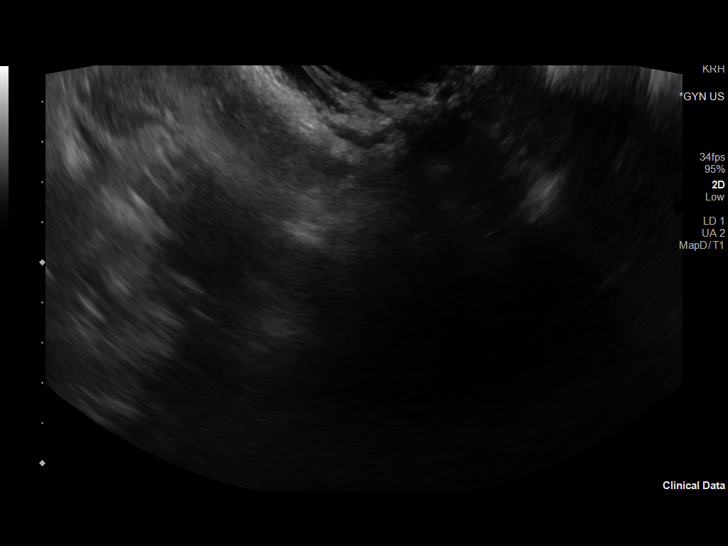
[im 71/71]
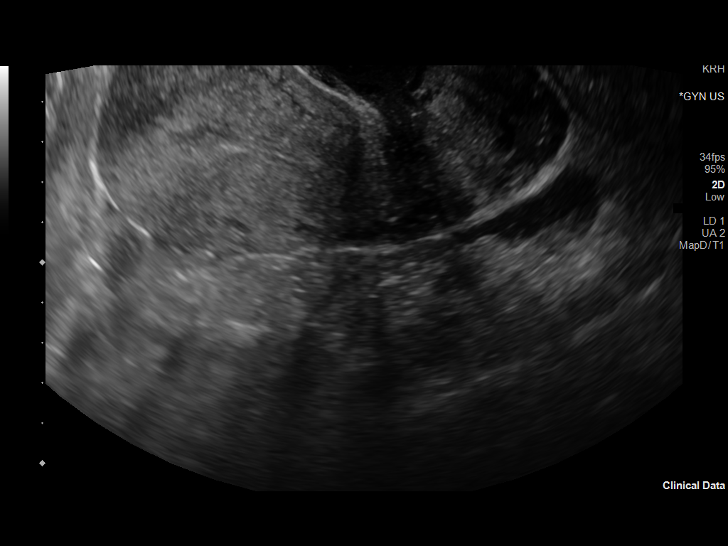

[13 of 25 positions shown; findings below may reference images not displayed]

FINDINGS: Uterus

Measurements: 11.7 x 5.6 x 7.1 cm = volume: 243 mL. Uterus is
anteverted. No fibroids or other mass visualized.

Endometrium

Thickness: 9 mm. Small amount of fluid in the endocervical canal may
be physiologic. No focal abnormality visualized.

Right ovary

Measurements: 3.8 x 2.0 x 2.9 cm = volume: 11.1 mL. Normal
appearance/no adnexal mass. The ovary is only visualized on
transabdominal exam. Normal blood flow.

Left ovary

Measurements: 3.3 x 2.6 x 2.7 cm = volume: 12.4 mL. Normal
appearance/no adnexal mass. Normal blood flow.

Pulsed Doppler evaluation of both ovaries demonstrates normal
low-resistance arterial and venous waveforms.

Other findings

Trace pelvic free fluid is likely physiologic.
IMPRESSION: 1. Essentially normal pelvic ultrasound. Trace free fluid in the
endocervical canal is likely physiologic.
2. Normal sonographic appearance of the ovaries with normal blood
flow.

## 2021-10-15 ENCOUNTER — Other Ambulatory Visit: Payer: Self-pay

## 2021-10-15 ENCOUNTER — Emergency Department (HOSPITAL_BASED_OUTPATIENT_CLINIC_OR_DEPARTMENT_OTHER)
Admission: EM | Admit: 2021-10-15 | Discharge: 2021-10-15 | Disposition: A | Discharge status: Home / Self Care | Payer: Medicaid Other | Attending: Emergency Medicine | Admitting: Emergency Medicine

## 2021-10-15 ENCOUNTER — Encounter (HOSPITAL_BASED_OUTPATIENT_CLINIC_OR_DEPARTMENT_OTHER): Payer: Self-pay | Admitting: Emergency Medicine

## 2021-10-15 DIAGNOSIS — J020 Streptococcal pharyngitis: Secondary | ICD-10-CM | POA: Insufficient documentation

## 2021-10-15 DIAGNOSIS — J029 Acute pharyngitis, unspecified: Secondary | ICD-10-CM | POA: Diagnosis present

## 2021-10-15 DIAGNOSIS — I10 Essential (primary) hypertension: Secondary | ICD-10-CM | POA: Insufficient documentation

## 2021-10-15 HISTORY — DX: Essential (primary) hypertension: I10

## 2021-10-15 LAB — GROUP A STREP BY PCR: Group A Strep by PCR: DETECTED — AB

## 2021-10-15 MED ORDER — PREDNISONE 10 MG PO TABS: 20.0000 mg | ORAL_TABLET | Freq: Two times a day (BID) | ORAL | 0 refills | Status: AC

## 2021-10-15 MED ORDER — CEPHALEXIN 250 MG PO CAPS
500.0000 mg | ORAL_CAPSULE | Freq: Once | ORAL | Status: AC
  Administered 2021-10-15: 500 mg via ORAL
  Filled 2021-10-15: qty 2

## 2021-10-15 MED ORDER — CEPHALEXIN 500 MG PO CAPS: 500.0000 mg | ORAL_CAPSULE | Freq: Four times a day (QID) | ORAL | 0 refills | Status: AC

## 2021-10-15 MED ORDER — PREDNISONE 20 MG PO TABS
20.0000 mg | ORAL_TABLET | Freq: Once | ORAL | Status: AC
  Administered 2021-10-15: 20 mg via ORAL
  Filled 2021-10-15: qty 1

## 2021-10-15 NOTE — ED Triage Notes (Signed)
Pt is c/o sore throat x 3 days and says her ears hurt  Pt is requesting a strep test

## 2021-10-15 NOTE — ED Provider Notes (Signed)
[facility] EMERGENCY DEPARTMENT Provider Note   CSN: [id number] Arrival date & time: 10/15/21  0149     History  Chief Complaint  Patient presents with   Sore Throat    Mackenzie Noble is a 31 y.o. female.  Patient is a 31 year old female with history of hypertension.  She presents today with complaints of sore throat.  This is been worsening over the past several days.  She denies any fevers or chills.  She describes difficulty swallowing and pain that is worse with swallowing.  She has slight cough that is nonproductive.  She tells me her son tested positive for strep 2 weeks ago.  The history is provided by the patient.  Sore Throat This is a new problem. Episode onset: 3 days ago. The problem occurs constantly. The problem has been gradually worsening. Pertinent negatives include no chest pain. The symptoms are aggravated by swallowing. Nothing relieves the symptoms.      Home Medications Prior to Admission medications   Medication Sig Start Date End Date Taking? Authorizing Provider  amoxicillin (AMOXIL) 500 MG capsule Take 500 mg by mouth 3 (three) times daily. 09/12/20   [provider]  fluconazole (DIFLUCAN) 150 MG tablet Take 150 mg by mouth once a week. 08/26/20   [provider]  fluconazole (DIFLUCAN) 200 MG tablet Take 200 mg by mouth once as needed. Yeast infection 09/12/20   [provider]  ibuprofen (ADVIL) 600 MG tablet Take 1 tablet (600 mg total) by mouth every 6 (six) hours as needed. 09/17/20   Fayrene Helper, PA-C  meloxicam (MOBIC) 7.5 MG tablet Take 7.5 mg by mouth daily as needed for pain.    [provider]  phenazopyridine (PYRIDIUM) 200 MG tablet Take 1 tablet (200 mg total) by mouth 3 (three) times daily as needed for pain. 09/17/20   Fayrene Helper, PA-C  tiZANidine (ZANAFLEX) 4 MG tablet Take 4 mg by mouth at bedtime as needed for muscle spasms.    [provider]      Allergies    Sulfa  antibiotics and Metronidazole    Review of Systems   Review of Systems  Cardiovascular:  Negative for chest pain.  All other systems reviewed and are negative.  Physical Exam Updated Vital Signs BP 122/86 (BP Location: Left Arm)    Pulse 75    Temp 98.7 F (37.1 C) (Oral)    Resp 18    Ht 5\' 3"  (1.6 m)    Wt 86.2 kg    LMP 10/05/2021 (Exact Date)    SpO2 100%    BMI 33.66 kg/m  Physical Exam Vitals and nursing note reviewed.  Constitutional:      General: She is not in acute distress.    Appearance: She is well-developed. She is not diaphoretic.  HENT:     Head: Normocephalic and atraumatic.     Right Ear: Tympanic membrane normal.     Left Ear: Tympanic membrane normal.     Nose: No congestion or rhinorrhea.     Mouth/Throat:     Mouth: Mucous membranes are moist.     Pharynx: Posterior oropharyngeal erythema present. No oropharyngeal exudate.     Tonsils: No tonsillar exudate or tonsillar abscesses.  Cardiovascular:     Rate and Rhythm: Normal rate and regular rhythm.     Heart sounds: No murmur heard.   No friction rub. No gallop.  Pulmonary:     Effort: Pulmonary effort is normal. No respiratory distress.  Breath sounds: Normal breath sounds. No wheezing.  Abdominal:     General: Bowel sounds are normal. There is no distension.     Palpations: Abdomen is soft.     Tenderness: There is no abdominal tenderness.  Musculoskeletal:        General: Normal range of motion.     Cervical back: Normal range of motion and neck supple.  Skin:    General: Skin is warm and dry.  Neurological:     General: No focal deficit present.     Mental Status: She is alert and oriented to person, place, and time.    ED Results / Procedures / Treatments   Labs (all labs ordered are listed, but only abnormal results are displayed) Labs Reviewed  GROUP A STREP BY PCR    EKG None  Radiology No results found.  Procedures Procedures    Medications Ordered in ED Medications -  No data to display  ED Course/ Medical Decision Making/ A&P  Strep test is positive.  Patient to be treated with Keflex and prednisone.  To follow-up as needed.  Final Clinical Impression(s) / ED Diagnoses Final diagnoses:  None    Rx / DC Orders ED Discharge Orders     None         Geoffery Lyons, MD 10/15/21 [phone]

## 2021-10-15 NOTE — Discharge Instructions (Signed)
Begin taking Keflex and prednisone as prescribed.  Return to the emergency department if you experience any new and/or concerning symptoms.

## 2021-10-15 NOTE — ED Notes (Signed)
ED Provider at bedside.
